# Patient Record
Sex: Male | Born: 1985
Health system: Southern US, Community
[De-identification: ages and names within clinical notes are randomized; demographics above are authoritative.]

## PROBLEM LIST (undated history)

## (undated) DIAGNOSIS — I10 Essential (primary) hypertension: Secondary | ICD-10-CM

## (undated) HISTORY — PX: SPLENECTOMY, TOTAL: SHX788

## (undated) HISTORY — PX: ADRENALECTOMY: SHX876

## (undated) HISTORY — PX: TOTAL NEPHRECTOMY: SHX415

## (undated) HISTORY — DX: Essential (primary) hypertension: I10

---

## 2019-09-25 DIAGNOSIS — K219 Gastro-esophageal reflux disease without esophagitis: Secondary | ICD-10-CM | POA: Diagnosis not present

## 2019-09-25 DIAGNOSIS — Z302 Encounter for sterilization: Secondary | ICD-10-CM | POA: Diagnosis not present

## 2019-09-25 DIAGNOSIS — I10 Essential (primary) hypertension: Secondary | ICD-10-CM | POA: Diagnosis not present

## 2019-10-26 DIAGNOSIS — Z3009 Encounter for other general counseling and advice on contraception: Secondary | ICD-10-CM | POA: Diagnosis not present

## 2019-10-30 DIAGNOSIS — Z302 Encounter for sterilization: Secondary | ICD-10-CM | POA: Diagnosis not present

## 2020-01-14 DIAGNOSIS — Z302 Encounter for sterilization: Secondary | ICD-10-CM | POA: Diagnosis not present

## 2020-04-17 DIAGNOSIS — E559 Vitamin D deficiency, unspecified: Secondary | ICD-10-CM | POA: Diagnosis not present

## 2020-04-17 DIAGNOSIS — M9080 Osteopathy in diseases classified elsewhere, unspecified site: Secondary | ICD-10-CM | POA: Diagnosis not present

## 2020-04-17 DIAGNOSIS — I10 Essential (primary) hypertension: Secondary | ICD-10-CM | POA: Diagnosis not present

## 2020-04-17 DIAGNOSIS — E781 Pure hyperglyceridemia: Secondary | ICD-10-CM | POA: Diagnosis not present

## 2020-04-17 DIAGNOSIS — Z1329 Encounter for screening for other suspected endocrine disorder: Secondary | ICD-10-CM | POA: Diagnosis not present

## 2020-04-17 DIAGNOSIS — E889 Metabolic disorder, unspecified: Secondary | ICD-10-CM | POA: Diagnosis not present

## 2020-04-17 DIAGNOSIS — Z905 Acquired absence of kidney: Secondary | ICD-10-CM | POA: Diagnosis not present

## 2020-04-22 DIAGNOSIS — I10 Essential (primary) hypertension: Secondary | ICD-10-CM | POA: Diagnosis not present

## 2020-04-22 DIAGNOSIS — E559 Vitamin D deficiency, unspecified: Secondary | ICD-10-CM | POA: Diagnosis not present

## 2020-04-22 DIAGNOSIS — E889 Metabolic disorder, unspecified: Secondary | ICD-10-CM | POA: Diagnosis not present

## 2020-04-22 DIAGNOSIS — Z905 Acquired absence of kidney: Secondary | ICD-10-CM | POA: Diagnosis not present

## 2020-06-25 DIAGNOSIS — M7711 Lateral epicondylitis, right elbow: Secondary | ICD-10-CM | POA: Diagnosis not present

## 2020-06-25 DIAGNOSIS — M25521 Pain in right elbow: Secondary | ICD-10-CM | POA: Diagnosis not present

## 2020-07-01 ENCOUNTER — Other Ambulatory Visit: Payer: Self-pay | Admitting: Physician Assistant

## 2020-07-01 DIAGNOSIS — M7711 Lateral epicondylitis, right elbow: Secondary | ICD-10-CM

## 2020-07-11 ENCOUNTER — Other Ambulatory Visit: Payer: Self-pay

## 2020-07-14 ENCOUNTER — Other Ambulatory Visit: Payer: Self-pay

## 2020-07-15 ENCOUNTER — Other Ambulatory Visit: Payer: Self-pay

## 2020-07-15 ENCOUNTER — Ambulatory Visit
Admission: RE | Admit: 2020-07-15 | Discharge: 2020-07-15 | Disposition: A | Discharge status: Home / Self Care | Payer: Federal, State, Local not specified - PPO | Source: Ambulatory Visit | Attending: Physician Assistant | Admitting: Physician Assistant

## 2020-07-15 DIAGNOSIS — M25521 Pain in right elbow: Secondary | ICD-10-CM | POA: Diagnosis not present

## 2020-07-15 DIAGNOSIS — M7711 Lateral epicondylitis, right elbow: Secondary | ICD-10-CM

## 2020-12-17 ENCOUNTER — Ambulatory Visit (INDEPENDENT_AMBULATORY_CARE_PROVIDER_SITE_OTHER): Payer: Federal, State, Local not specified - PPO | Admitting: Sports Medicine

## 2020-12-17 ENCOUNTER — Other Ambulatory Visit: Payer: Self-pay

## 2020-12-17 ENCOUNTER — Ambulatory Visit
Admission: RE | Admit: 2020-12-17 | Discharge: 2020-12-17 | Disposition: A | Discharge status: Home / Self Care | Payer: Federal, State, Local not specified - PPO | Source: Ambulatory Visit | Attending: Sports Medicine | Admitting: Sports Medicine

## 2020-12-17 DIAGNOSIS — S6991XA Unspecified injury of right wrist, hand and finger(s), initial encounter: Secondary | ICD-10-CM

## 2020-12-17 DIAGNOSIS — S62144A Nondisplaced fracture of body of hamate [unciform] bone, right wrist, initial encounter for closed fracture: Secondary | ICD-10-CM | POA: Diagnosis not present

## 2020-12-17 DIAGNOSIS — M7989 Other specified soft tissue disorders: Secondary | ICD-10-CM | POA: Diagnosis not present

## 2020-12-17 DIAGNOSIS — S62154A Nondisplaced fracture of hook process of hamate [unciform] bone, right wrist, initial encounter for closed fracture: Secondary | ICD-10-CM | POA: Diagnosis not present

## 2020-12-17 DIAGNOSIS — S62151A Displaced fracture of hook process of hamate [unciform] bone, right wrist, initial encounter for closed fracture: Secondary | ICD-10-CM | POA: Insufficient documentation

## 2020-12-17 MED ORDER — ACETAMINOPHEN ER 650 MG PO TBCR: 650.0000 mg | EXTENDED_RELEASE_TABLET | Freq: Three times a day (TID) | ORAL | 3 refills | Status: AC | PRN

## 2020-12-17 NOTE — Assessment & Plan Note (Addendum)
This is a pleasant 35 year old male, had a fall about a week and a half ago, severe pain, swelling on the ulnar aspect of his hand at the level of the carpus, he had an unofficial x-ray at the [place], read by the PA as negative, on exam he has severe tenderness at the base of the fifth metacarpal, pain with extension of the wrist, he does not have significant pain with ulnar deviation. It is swollen. Neurovascularly intact distally. I am going to go ahead and get a CT scan of his hand for full confirmation, in the meantime he will do arthritis from Tylenol, avoiding NSAIDs as he has a solitary kidney, we will also place him in a Velcro brace.  Update: There is an acute fracture through the hook of the hamate bone, continue the brace as discussed, we will consider cast placement when he comes back from his trip.

## 2020-12-17 NOTE — Progress Notes (Addendum)
    Procedures performed today:    None.  Independent interpretation of notes and tests performed by another provider:   None.  Brief History, Exam, Impression, and Recommendations:    Fracture of hook process of hamate of right wrist This is a pleasant 35 year old male, had a fall about a week and a half ago, severe pain, swelling on the ulnar aspect of his hand at the level of the carpus, he had an unofficial x-ray at the [place], read by the PA as negative, on exam he has severe tenderness at the base of the fifth metacarpal, pain with extension of the wrist, he does not have significant pain with ulnar deviation. It is swollen. Neurovascularly intact distally. I am going to go ahead and get a CT scan of his hand for full confirmation, in the meantime he will do arthritis from Tylenol, avoiding NSAIDs as he has a solitary kidney, we will also place him in a Velcro brace.  Update: There is an acute fracture through the hook of the hamate bone, continue the brace as discussed, we will consider cast placement when he comes back from his trip.    ___________________________________________ Ihor Austin. Benjamin Stain, M.D., ABFM., CAQSM. Primary Care and Sports Medicine  [facility]  Adjunct Instructor of Family Medicine  [employer]

## 2020-12-23 ENCOUNTER — Other Ambulatory Visit: Payer: Self-pay

## 2020-12-23 ENCOUNTER — Encounter: Payer: Federal, State, Local not specified - PPO | Admitting: Sports Medicine

## 2020-12-23 ENCOUNTER — Ambulatory Visit: Payer: Federal, State, Local not specified - PPO | Admitting: Sports Medicine

## 2020-12-23 DIAGNOSIS — S62154D Nondisplaced fracture of hook process of hamate [unciform] bone, right wrist, subsequent encounter for fracture with routine healing: Secondary | ICD-10-CM

## 2020-12-23 NOTE — Assessment & Plan Note (Signed)
Sean Powers returns, CT did show a nondisplaced fracture through the hook of the hamate, applied a short arm cast today, he will get a second opinion from Dr. Amanda Pea. Return to see me in 6 weeks for consideration of cast removal.

## 2020-12-23 NOTE — Progress Notes (Signed)
    Procedures performed today:    Short arm cast placed today  Independent interpretation of notes and tests performed by another provider:   None.  Brief History, Exam, Impression, and Recommendations:    Fracture of hook process of hamate of right wrist Sean Powers returns, CT did show a nondisplaced fracture through the hook of the hamate, applied a short arm cast today, he will get a second opinion from Dr. Amanda Pea. Return to see me in 6 weeks for consideration of cast removal.    ___________________________________________ Ihor Austin. Benjamin Stain, M.D., ABFM., CAQSM. Primary Care and Sports Medicine [facility]  Adjunct Instructor of Family Medicine  [employer]

## 2020-12-29 DIAGNOSIS — S62144A Nondisplaced fracture of body of hamate [unciform] bone, right wrist, initial encounter for closed fracture: Secondary | ICD-10-CM | POA: Diagnosis not present

## 2021-01-07 ENCOUNTER — Ambulatory Visit: Payer: Federal, State, Local not specified - PPO | Admitting: Sports Medicine

## 2021-01-12 ENCOUNTER — Other Ambulatory Visit: Payer: Self-pay | Admitting: Orthopedic Surgery

## 2021-01-12 DIAGNOSIS — S62144A Nondisplaced fracture of body of hamate [unciform] bone, right wrist, initial encounter for closed fracture: Secondary | ICD-10-CM | POA: Diagnosis not present

## 2021-01-12 DIAGNOSIS — M25531 Pain in right wrist: Secondary | ICD-10-CM | POA: Diagnosis not present

## 2021-02-03 ENCOUNTER — Ambulatory Visit: Payer: Federal, State, Local not specified - PPO | Admitting: Sports Medicine

## 2021-02-05 ENCOUNTER — Other Ambulatory Visit: Payer: Self-pay

## 2021-02-05 ENCOUNTER — Ambulatory Visit
Admission: RE | Admit: 2021-02-05 | Discharge: 2021-02-05 | Disposition: A | Discharge status: Home / Self Care | Payer: Federal, State, Local not specified - PPO | Source: Ambulatory Visit | Attending: Orthopedic Surgery | Admitting: Orthopedic Surgery

## 2021-02-05 DIAGNOSIS — S62154A Nondisplaced fracture of hook process of hamate [unciform] bone, right wrist, initial encounter for closed fracture: Secondary | ICD-10-CM | POA: Diagnosis not present

## 2021-02-05 DIAGNOSIS — S62144A Nondisplaced fracture of body of hamate [unciform] bone, right wrist, initial encounter for closed fracture: Secondary | ICD-10-CM

## 2021-02-05 DIAGNOSIS — M25531 Pain in right wrist: Secondary | ICD-10-CM | POA: Diagnosis not present

## 2021-02-05 DIAGNOSIS — S62141D Displaced fracture of body of hamate [unciform] bone, right wrist, subsequent encounter for fracture with routine healing: Secondary | ICD-10-CM | POA: Diagnosis not present

## 2021-02-06 ENCOUNTER — Other Ambulatory Visit: Payer: Self-pay | Admitting: Orthopedic Surgery

## 2021-02-06 DIAGNOSIS — S62144A Nondisplaced fracture of body of hamate [unciform] bone, right wrist, initial encounter for closed fracture: Secondary | ICD-10-CM

## 2021-03-20 ENCOUNTER — Other Ambulatory Visit: Payer: Self-pay

## 2021-03-20 ENCOUNTER — Ambulatory Visit
Admission: RE | Admit: 2021-03-20 | Discharge: 2021-03-20 | Disposition: A | Discharge status: Home / Self Care | Payer: Federal, State, Local not specified - PPO | Source: Ambulatory Visit | Attending: Orthopedic Surgery | Admitting: Orthopedic Surgery

## 2021-03-20 DIAGNOSIS — S62144A Nondisplaced fracture of body of hamate [unciform] bone, right wrist, initial encounter for closed fracture: Secondary | ICD-10-CM | POA: Diagnosis not present

## 2021-03-20 DIAGNOSIS — S62154A Nondisplaced fracture of hook process of hamate [unciform] bone, right wrist, initial encounter for closed fracture: Secondary | ICD-10-CM | POA: Diagnosis not present

## 2021-03-23 DIAGNOSIS — M25531 Pain in right wrist: Secondary | ICD-10-CM | POA: Diagnosis not present

## 2021-03-23 DIAGNOSIS — S62144A Nondisplaced fracture of body of hamate [unciform] bone, right wrist, initial encounter for closed fracture: Secondary | ICD-10-CM | POA: Diagnosis not present

## 2021-06-17 DIAGNOSIS — E559 Vitamin D deficiency, unspecified: Secondary | ICD-10-CM | POA: Diagnosis not present

## 2021-06-17 DIAGNOSIS — Z905 Acquired absence of kidney: Secondary | ICD-10-CM | POA: Diagnosis not present

## 2021-06-17 DIAGNOSIS — I10 Essential (primary) hypertension: Secondary | ICD-10-CM | POA: Diagnosis not present

## 2021-06-18 DIAGNOSIS — I10 Essential (primary) hypertension: Secondary | ICD-10-CM | POA: Diagnosis not present

## 2021-06-18 DIAGNOSIS — Z905 Acquired absence of kidney: Secondary | ICD-10-CM | POA: Diagnosis not present

## 2021-06-18 DIAGNOSIS — I456 Pre-excitation syndrome: Secondary | ICD-10-CM | POA: Diagnosis not present

## 2021-06-18 DIAGNOSIS — M898X9 Other specified disorders of bone, unspecified site: Secondary | ICD-10-CM | POA: Diagnosis not present

## 2021-07-08 ENCOUNTER — Other Ambulatory Visit: Payer: Self-pay | Admitting: Orthopedic Surgery

## 2021-07-08 DIAGNOSIS — S62144A Nondisplaced fracture of body of hamate [unciform] bone, right wrist, initial encounter for closed fracture: Secondary | ICD-10-CM

## 2021-08-11 ENCOUNTER — Ambulatory Visit
Admission: RE | Admit: 2021-08-11 | Discharge: 2021-08-11 | Disposition: A | Discharge status: Home / Self Care | Payer: Federal, State, Local not specified - PPO | Source: Ambulatory Visit | Attending: Orthopedic Surgery | Admitting: Orthopedic Surgery

## 2021-08-11 DIAGNOSIS — S62151A Displaced fracture of hook process of hamate [unciform] bone, right wrist, initial encounter for closed fracture: Secondary | ICD-10-CM | POA: Diagnosis not present

## 2021-08-11 DIAGNOSIS — S62144A Nondisplaced fracture of body of hamate [unciform] bone, right wrist, initial encounter for closed fracture: Secondary | ICD-10-CM

## 2021-08-14 ENCOUNTER — Other Ambulatory Visit: Payer: Federal, State, Local not specified - PPO

## 2021-08-17 DIAGNOSIS — S62144A Nondisplaced fracture of body of hamate [unciform] bone, right wrist, initial encounter for closed fracture: Secondary | ICD-10-CM | POA: Diagnosis not present

## 2021-08-17 DIAGNOSIS — M25531 Pain in right wrist: Secondary | ICD-10-CM | POA: Diagnosis not present

## 2021-09-22 HISTORY — PX: WRIST SURGERY: SHX841

## 2021-10-02 DIAGNOSIS — G8918 Other acute postprocedural pain: Secondary | ICD-10-CM | POA: Diagnosis not present

## 2021-10-02 DIAGNOSIS — S62151K Displaced fracture of hook process of hamate [unciform] bone, right wrist, subsequent encounter for fracture with nonunion: Secondary | ICD-10-CM | POA: Diagnosis not present

## 2021-10-14 DIAGNOSIS — Z4789 Encounter for other orthopedic aftercare: Secondary | ICD-10-CM | POA: Diagnosis not present

## 2021-10-15 DIAGNOSIS — Z4789 Encounter for other orthopedic aftercare: Secondary | ICD-10-CM | POA: Diagnosis not present

## 2021-10-22 DIAGNOSIS — M25641 Stiffness of right hand, not elsewhere classified: Secondary | ICD-10-CM | POA: Diagnosis not present

## 2021-10-29 DIAGNOSIS — M25641 Stiffness of right hand, not elsewhere classified: Secondary | ICD-10-CM | POA: Diagnosis not present

## 2021-11-13 DIAGNOSIS — M25641 Stiffness of right hand, not elsewhere classified: Secondary | ICD-10-CM | POA: Diagnosis not present

## 2021-12-31 ENCOUNTER — Encounter: Payer: Self-pay | Admitting: Emergency Medicine

## 2021-12-31 ENCOUNTER — Ambulatory Visit
Admission: EM | Admit: 2021-12-31 | Discharge: 2021-12-31 | Disposition: A | Discharge status: Home / Self Care | Payer: Federal, State, Local not specified - PPO | Attending: Family Medicine | Admitting: Family Medicine

## 2021-12-31 DIAGNOSIS — N451 Epididymitis: Secondary | ICD-10-CM

## 2021-12-31 HISTORY — DX: Essential (primary) hypertension: I10

## 2021-12-31 LAB — POCT URINALYSIS DIP (MANUAL ENTRY)
Bilirubin, UA: NEGATIVE
Blood, UA: NEGATIVE
Glucose, UA: NEGATIVE mg/dL
Ketones, POC UA: NEGATIVE mg/dL
Leukocytes, UA: NEGATIVE
Nitrite, UA: NEGATIVE
Protein Ur, POC: NEGATIVE mg/dL
Spec Grav, UA: 1.015 (ref 1.010–1.025)
Urobilinogen, UA: 0.2 E.U./dL
pH, UA: 7 (ref 5.0–8.0)

## 2021-12-31 MED ORDER — DOXYCYCLINE HYCLATE 100 MG PO CAPS: 100.0000 mg | ORAL_CAPSULE | Freq: Two times a day (BID) | ORAL | 0 refills | Status: AC

## 2021-12-31 NOTE — Discharge Instructions (Addendum)
Advised patient to take medication as directed with food to completion.  Encouraged patient increase daily water intake while taking this medication.  Advised patient if symptoms worsen and/or unresolved please follow-up with PCP, urology, local ED, or here for further evaluation.

## 2021-12-31 NOTE — ED Triage Notes (Signed)
Right testicular pain  Dull ache  Less pain today due to not being on his feet at much today  Vasectomy 2 years ago  Denies pain  No ice or elevation or OTC pain meds Pt only has 1 kidney  Left nephrectomy at age 36 due to trauma

## 2021-12-31 NOTE — ED Provider Notes (Signed)
[facility]    CSN: [id number] Arrival date & time: 12/31/21  1541      History   Chief Complaint Chief Complaint  Patient presents with   Testicle Pain    Right     HPI Sean Powers is a 36 y.o. male.   HPI 36 year old male presents with right testicle pain for 2 days.  Patient believes he may have epididymitis.  Reports ache is dull and less pain today as he was not on his feet much.  Reports a vasectomy 2 years ago.  Patient has only right kidney secondary to left nephrectomy at age 31 due to trauma.  PMH significant for HTN.   Past Medical History:  Diagnosis Date   Hypertension     Patient Active Problem List   Diagnosis Date Noted   Fracture of hook process of hamate of right wrist 12/17/2020    Past Surgical History:  Procedure Laterality Date   ADRENALECTOMY     age 30   SPLENECTOMY, TOTAL     age 21   TOTAL NEPHRECTOMY Left    age 8   WRIST SURGERY Right 09/2021   fracture       Home Medications    Prior to Admission medications   Medication Sig Start Date End Date Taking? Authorizing Provider  doxycycline (VIBRAMYCIN) 100 MG capsule Take 1 capsule (100 mg total) by mouth 2 (two) times daily for 10 days. 12/31/21 01/10/22 Yes Trevor Iha, FNP  acetaminophen (TYLENOL) 650 MG CR tablet Take 1 tablet (650 mg total) by mouth every 8 (eight) hours as needed for pain. 12/17/20   Monica Becton, MD  Cholecalciferol 125 MCG (5000 UT) TABS Take by mouth.    [provider]  diltiazem (TIAZAC) 300 MG 24 hr capsule Take 1 capsule by mouth daily. 04/22/20   [provider]  omeprazole (PRILOSEC) 20 MG capsule Take 1 capsule by mouth daily. 05/31/16   [provider]    Family History Family History  Problem Relation Age of Onset   Hypertension Mother    Healthy Father     Social History Social History   Tobacco Use   Smoking status: Never    Passive exposure: Never   Smokeless tobacco: Never  Vaping  Use   Vaping Use: Never used  Substance Use Topics   Alcohol use: Yes    Alcohol/week: 6.0 standard drinks of alcohol    Types: 6 Cans of beer per week     Allergies   Patient has no known allergies.   Review of Systems Review of Systems  Genitourinary:  Positive for testicular pain.  All other systems reviewed and are negative.    Physical Exam Triage Vital Signs ED Triage Vitals  Enc Vitals Group     BP 12/31/21 1637 (!) 151/97     Pulse Rate 12/31/21 1637 94     Resp 12/31/21 1637 14     Temp 12/31/21 1637 98.6 F (37 C)     Temp Source 12/31/21 1637 Oral     SpO2 12/31/21 1637 100 %     Weight 12/31/21 1639 233 lb (105.7 kg)     Height 12/31/21 1639 6\' 3"  (1.905 m)     Head Circumference --      Peak Flow --      Pain Score 12/31/21 1639 4     Pain Loc --      Pain Edu? --      Excl. in [place]? --  No data found.  Updated Vital Signs BP (!) 151/97 (BP Location: Left Arm)   Pulse 94   Temp 98.6 F (37 C) (Oral)   Resp 14   Ht 6\' 3"  (1.905 m)   Wt 233 lb (105.7 kg)   SpO2 100%   BMI 29.12 kg/m      Physical Exam Vitals and nursing note reviewed.  Constitutional:      Appearance: Normal appearance. He is normal weight.  HENT:     Head: Normocephalic and atraumatic.     Mouth/Throat:     Mouth: Mucous membranes are moist.     Pharynx: Oropharynx is clear.  Eyes:     Extraocular Movements: Extraocular movements intact.     Conjunctiva/sclera: Conjunctivae normal.     Pupils: Pupils are equal, round, and reactive to light.  Cardiovascular:     Rate and Rhythm: Normal rate and regular rhythm.     Pulses: Normal pulses.     Heart sounds: Normal heart sounds.  Pulmonary:     Effort: Pulmonary effort is normal.     Breath sounds: Normal breath sounds. No wheezing, rhonchi or rales.  Genitourinary:    Comments: Posterior pole of right testicle, TTP/swollen epididymis, small (~1.5 cm) oval-shaped varicocele note Musculoskeletal:        General:  Normal range of motion.     Cervical back: Normal range of motion and neck supple.  Skin:    General: Skin is warm and dry.  Neurological:     General: No focal deficit present.     Mental Status: He is alert and oriented to person, place, and time.      UC Treatments / Results  Labs (all labs ordered are listed, but only abnormal results are displayed) Labs Reviewed  POCT URINALYSIS DIP (MANUAL ENTRY)    EKG   Radiology No results found.  Procedures Procedures (including critical care time)  Medications Ordered in UC Medications - No data to display  Initial Impression / Assessment and Plan / UC Course  I have reviewed the triage vital signs and the nursing notes.  Pertinent labs & imaging results that were available during my care of the patient were reviewed by me and considered in my medical decision making (see chart for details).     MDM: 1. Epididymitis, right-Rx'd Doxycycline. Advised patient to take medication as directed with food to completion.  Encouraged patient increase daily water intake while taking this medication.  Advised patient if symptoms worsen and/or unresolved please follow-up with PCP, urology, local ED, or here for further evaluation. Advised patient to take medication as directed with food to completion.  Encouraged patient increase daily water intake while taking this medication.  Advised patient if symptoms worsen and/or unresolved please follow-up with PCP, urology, local ED, or here for further evaluation.  Discharged home, hemodynamically stable. Final Clinical Impressions(s) / UC Diagnoses   Final diagnoses:  Epididymitis, right     Discharge Instructions      Advised patient to take medication as directed with food to completion.  Encouraged patient increase daily water intake while taking this medication.  Advised patient if symptoms worsen and/or unresolved please follow-up with PCP, urology, local ED, or here for further  evaluation.     ED Prescriptions     Medication Sig Dispense Auth. Provider   doxycycline (VIBRAMYCIN) 100 MG capsule Take 1 capsule (100 mg total) by mouth 2 (two) times daily for 10 days. 20 capsule , FNP  PDMP not reviewed this encounter.   Trevor Iha, FNP 12/31/21 1712

## 2022-01-01 ENCOUNTER — Telehealth: Payer: Self-pay

## 2022-01-01 NOTE — Telephone Encounter (Signed)
TC to f/u with pt after yesterday's visit to [facility]. Pt states he is doing okay and has no problems or questions at this time.

## 2022-04-15 ENCOUNTER — Encounter: Payer: Self-pay | Admitting: Emergency Medicine

## 2022-04-15 ENCOUNTER — Ambulatory Visit
Admission: EM | Admit: 2022-04-15 | Discharge: 2022-04-15 | Disposition: A | Discharge status: Home / Self Care | Payer: Federal, State, Local not specified - PPO | Attending: Family Medicine | Admitting: Family Medicine

## 2022-04-15 DIAGNOSIS — J111 Influenza due to unidentified influenza virus with other respiratory manifestations: Secondary | ICD-10-CM

## 2022-04-15 LAB — POCT INFLUENZA A/B
Influenza A, POC: NEGATIVE
Influenza B, POC: NEGATIVE

## 2022-04-15 LAB — POC SARS CORONAVIRUS 2 AG -  ED: SARS Coronavirus 2 Ag: NEGATIVE

## 2022-04-15 MED ORDER — ACETAMINOPHEN 500 MG PO TABS: 1000.0000 mg | ORAL_TABLET | Freq: Once | ORAL | Status: DC

## 2022-04-15 NOTE — ED Provider Notes (Signed)
[facility]   22 CSN: [id number] Arrival date & time: 04/15/22  1034      History   Chief Complaint Chief Complaint  Patient presents with   Cough    HPI Sean Powers is a 37 y.o. male.   HPI  Patient is a Marine scientist at the [employer].  He states that he had some cough and congestion yesterday.  Today he developed fever, body aches, headache.  Temperature last night was 102.  Tried to go to work today but started feeling worse.  Body aches and chills.  Provide he presents today with a temperature of 101.6 and pulse of 110.  He is fully vaccinated  Past Medical History:  Diagnosis Date   Hypertension     Patient Active Problem List   Diagnosis Date Noted   Fracture of hook process of hamate of right wrist 12/17/2020    Past Surgical History:  Procedure Laterality Date   ADRENALECTOMY     age 70   SPLENECTOMY, TOTAL     age 28   TOTAL NEPHRECTOMY Left    age 14   WRIST SURGERY Right 09/2021   fracture       Home Medications    Prior to Admission medications   Medication Sig Start Date End Date Taking? Authorizing Provider  acetaminophen (TYLENOL) 650 MG CR tablet Take 1 tablet (650 mg total) by mouth every 8 (eight) hours as needed for pain. 12/17/20  Yes Silverio Decamp, MD  Cholecalciferol 125 MCG (5000 UT) TABS Take by mouth.   Yes [provider]  diltiazem (TIAZAC) 300 MG 24 hr capsule Take 1 capsule by mouth daily. 04/22/20  Yes [provider]  omeprazole (PRILOSEC) 20 MG capsule Take 1 capsule by mouth daily. 05/31/16  Yes [provider]    Family History Family History  Problem Relation Age of Onset   Hypertension Mother    Healthy Father     Social History Social History   Tobacco Use   Smoking status: Never    Passive exposure: Never   Smokeless tobacco: Never  Vaping Use   Vaping Use: Never used  Substance Use Topics   Alcohol use: Yes    Alcohol/week: 6.0 standard drinks of alcohol     Types: 6 Cans of beer per week   Drug use: Never     Allergies   Patient has no known allergies.   Review of Systems Review of Systems See HPI  Physical Exam Triage Vital Signs ED Triage Vitals  Enc Vitals Group     BP 04/15/22 1118 125/82     Pulse Rate 04/15/22 1118 (!) 110     Resp 04/15/22 1118 18     Temp 04/15/22 1118 (!) 101.6 F (38.7 C)     Temp Source 04/15/22 1118 Oral     SpO2 04/15/22 1118 100 %     Weight 04/15/22 1120 225 lb (102.1 kg)     Height 04/15/22 1120 6' 3"$  (1.905 m)     Head Circumference --      Peak Flow --      Pain Score 04/15/22 1120 2     Pain Loc --      Pain Edu? --      Excl. in [place]? --    No data found.  Updated Vital Signs BP 125/82 (BP Location: Right Arm)   Pulse (!) 110   Temp (!) 101.6 F (38.7 C) (Oral)   Resp 18  Ht 6' 3"$  (1.905 m)   Wt 102.1 kg   SpO2 100%   BMI 28.12 kg/m      Physical Exam Constitutional:      General: He is not in acute distress.    Appearance: He is well-developed. He is diaphoretic.  HENT:     Head: Normocephalic and atraumatic.     Right Ear: Tympanic membrane and ear canal normal.     Left Ear: Tympanic membrane and ear canal normal.     Nose: Rhinorrhea present.     Mouth/Throat:     Pharynx: Posterior oropharyngeal erythema present.  Eyes:     Conjunctiva/sclera: Conjunctivae normal.     Pupils: Pupils are equal, round, and reactive to light.  Cardiovascular:     Rate and Rhythm: Tachycardia present.     Heart sounds: Normal heart sounds.  Pulmonary:     Effort: Pulmonary effort is normal. No respiratory distress.     Breath sounds: Normal breath sounds. No wheezing or rhonchi.  Abdominal:     General: There is no distension.     Palpations: Abdomen is soft.  Musculoskeletal:        General: Normal range of motion.     Cervical back: Normal range of motion.  Skin:    General: Skin is warm.  Neurological:     Mental Status: He is alert.      UC Treatments / Results   Labs (all labs ordered are listed, but only abnormal results are displayed) Labs Reviewed  POC SARS CORONAVIRUS 2 AG -  ED  POCT INFLUENZA A/B    EKG   Radiology No results found.  Procedures Procedures (including critical care time)  Medications Ordered in UC Medications  acetaminophen (TYLENOL) tablet 1,000 mg (1,000 mg Oral Patient Refused/Not Given 04/15/22 1137)    Initial Impression / Assessment and Plan / UC Course  I have reviewed the triage vital signs and the nursing notes.  Pertinent labs & imaging results that were available during my care of the patient were reviewed by me and considered in my medical decision making (see chart for details).     The flu and COVID tests are both negative Patient has a flulike illness.  Treat symptomatically Final Clinical Impressions(s) / UC Diagnoses   Final diagnoses:  Influenza-like illness     Discharge Instructions      Drink lots of fluids Take over-the-counter cough and cold medicine as needed Tylenol or ibuprofen for pain and fever Follow-up with your doctor if not improving by next week     ED Prescriptions   None    PDMP not reviewed this encounter.   Raylene Everts, MD 04/15/22 1146

## 2022-04-15 NOTE — Discharge Instructions (Signed)
Drink lots of fluids Take over-the-counter cough and cold medicine as needed Tylenol or ibuprofen for pain and fever Follow-up with your doctor if not improving by next week

## 2022-04-15 NOTE — ED Notes (Signed)
Offered patient Tylenol @ triage and patient refused, states he will wait until he gets home.

## 2022-04-15 NOTE — ED Triage Notes (Signed)
Patient c/o cough, low grade fever, congestion x 2 days.  Patient has taken Dayquil, fever last night was 102.  Body aches and chills.  Patient has taken Nyquil and Tylenol.

## 2022-04-21 DIAGNOSIS — L814 Other melanin hyperpigmentation: Secondary | ICD-10-CM | POA: Diagnosis not present

## 2022-04-21 DIAGNOSIS — Z129 Encounter for screening for malignant neoplasm, site unspecified: Secondary | ICD-10-CM | POA: Diagnosis not present

## 2022-04-21 DIAGNOSIS — L720 Epidermal cyst: Secondary | ICD-10-CM | POA: Diagnosis not present

## 2022-04-21 DIAGNOSIS — D225 Melanocytic nevi of trunk: Secondary | ICD-10-CM | POA: Diagnosis not present

## 2022-11-18 DIAGNOSIS — E559 Vitamin D deficiency, unspecified: Secondary | ICD-10-CM | POA: Diagnosis not present

## 2022-11-18 DIAGNOSIS — R0789 Other chest pain: Secondary | ICD-10-CM | POA: Diagnosis not present

## 2022-11-18 DIAGNOSIS — Z0000 Encounter for general adult medical examination without abnormal findings: Secondary | ICD-10-CM | POA: Diagnosis not present

## 2022-11-18 DIAGNOSIS — I10 Essential (primary) hypertension: Secondary | ICD-10-CM | POA: Diagnosis not present

## 2022-11-18 DIAGNOSIS — K219 Gastro-esophageal reflux disease without esophagitis: Secondary | ICD-10-CM | POA: Diagnosis not present

## 2022-11-25 DIAGNOSIS — Z905 Acquired absence of kidney: Secondary | ICD-10-CM | POA: Diagnosis not present

## 2022-11-25 DIAGNOSIS — I456 Pre-excitation syndrome: Secondary | ICD-10-CM | POA: Diagnosis not present

## 2022-11-25 DIAGNOSIS — M898X9 Other specified disorders of bone, unspecified site: Secondary | ICD-10-CM | POA: Diagnosis not present

## 2022-11-25 DIAGNOSIS — I10 Essential (primary) hypertension: Secondary | ICD-10-CM | POA: Diagnosis not present

## 2023-06-17 ENCOUNTER — Encounter: Payer: Self-pay | Admitting: Gastroenterology

## 2023-08-16 IMAGING — CT CT WRIST*R* W/O CM
1 series · 12 of 14 positions shown, 15 images · non-contrast
Comparison: CT hand 12/17/2020.

CLINICAL DATA: Fractured hamate bone, followup. History of injury
to hand.

EXAM:
CT OF THE RIGHT WRIST WITHOUT CONTRAST
TECHNIQUE: Multidetector CT imaging of the right wrist was performed according
to the standard protocol. Multiplanar CT image reconstructions were
also generated.

[Series 3: ext-soft · axial · 0.35mm/px · z∈[+26,+124]mm · 12 of 59 slices shown, 15 images]
[im 5/59  soft-tissue]
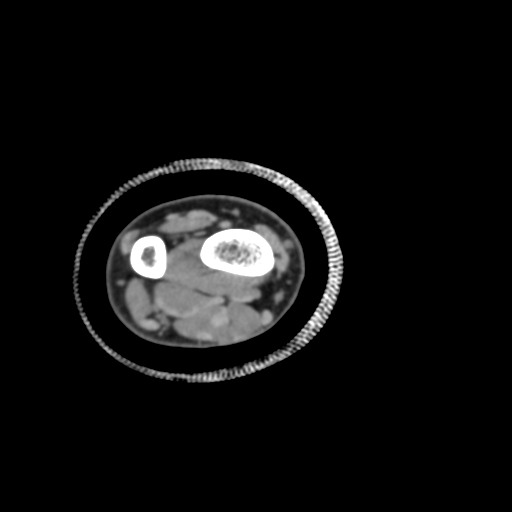
[im 5/59  bone]
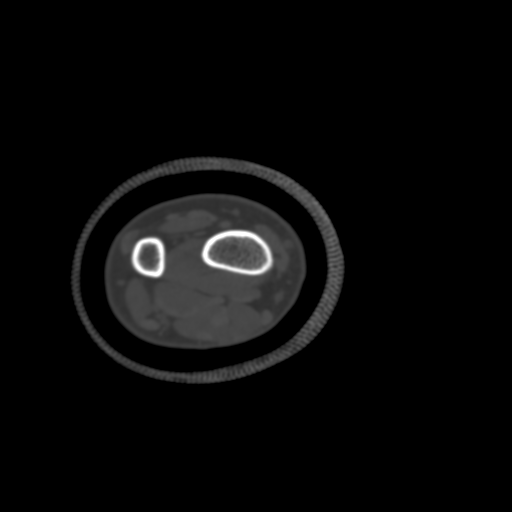
[im 9/59  bone]
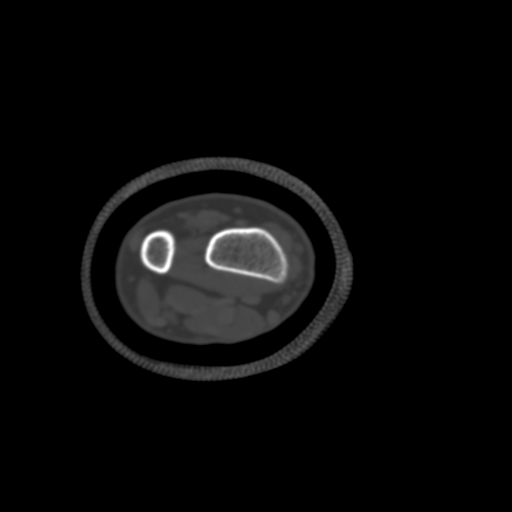
[im 14/59  bone]
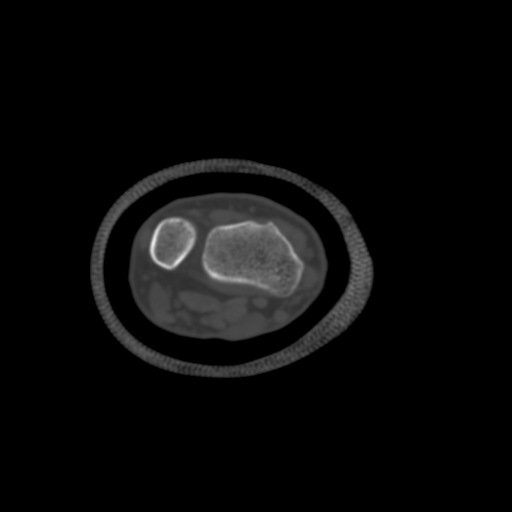
[im 18/59  bone]
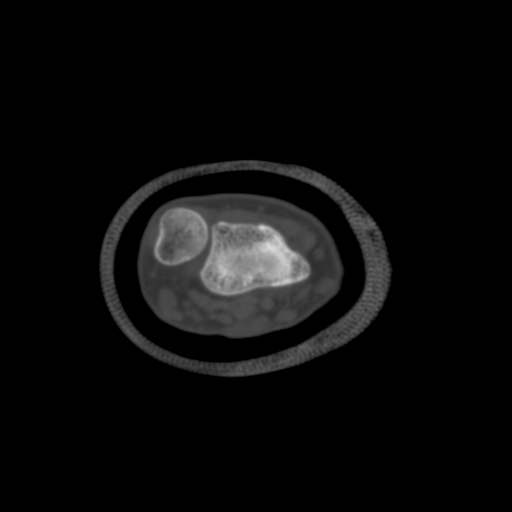
[im 23/59  soft-tissue]
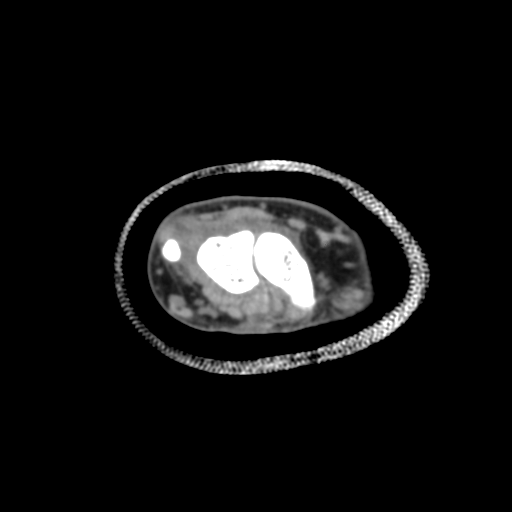
[im 23/59  bone]
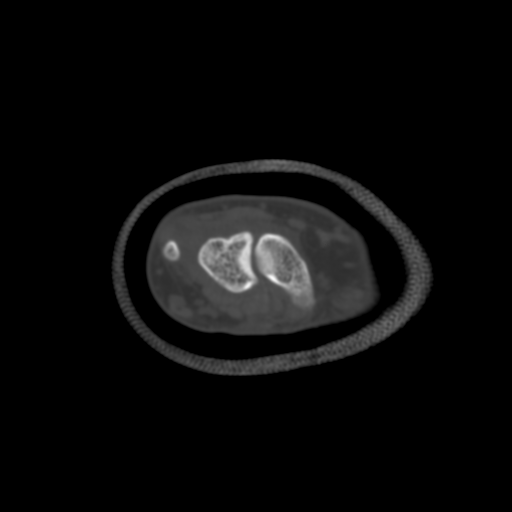
[im 27/59  bone]
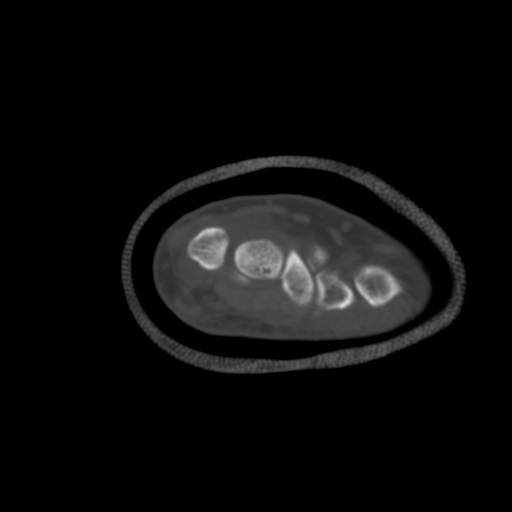
[im 32/59  bone]
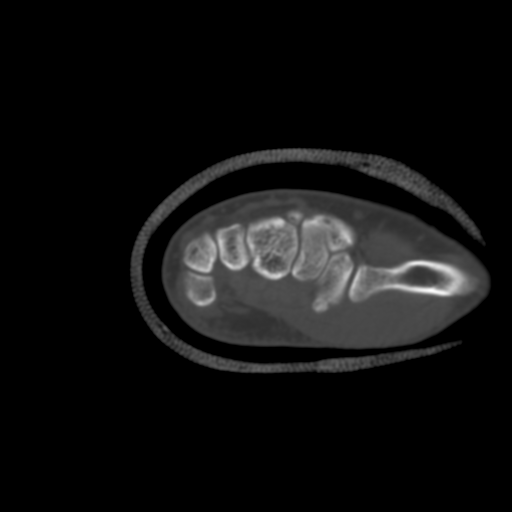
[im 36/59  bone]
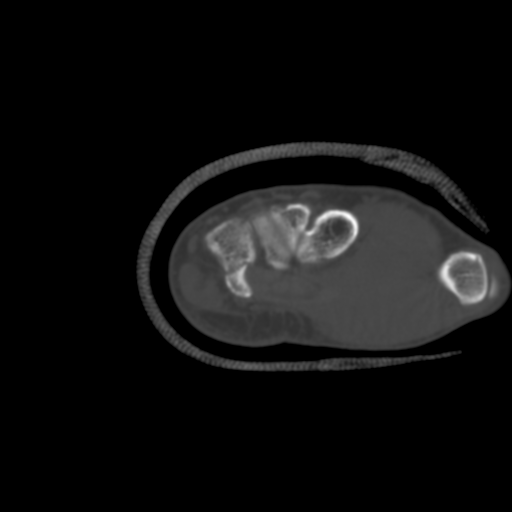
[im 41/59  soft-tissue]
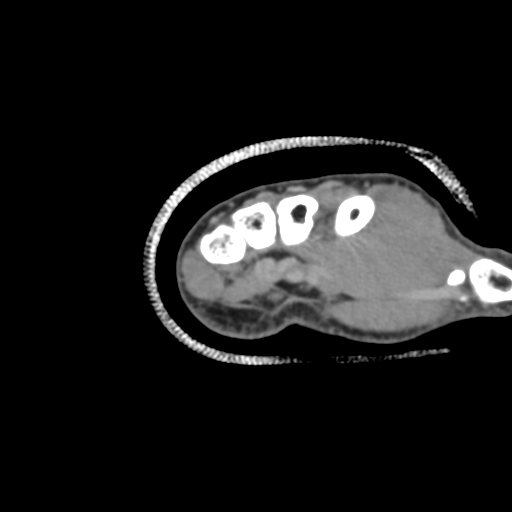
[im 41/59  bone]
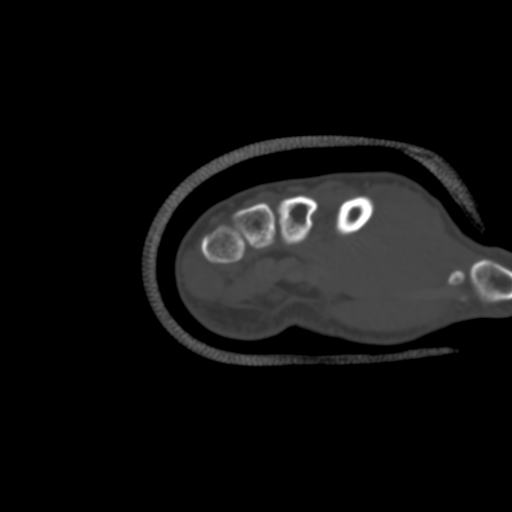
[im 45/59  bone]
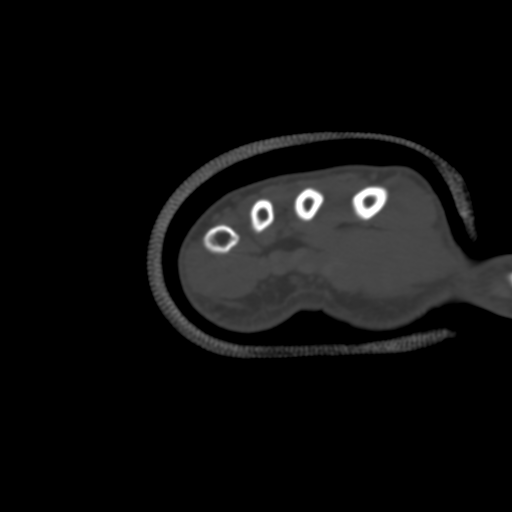
[im 50/59  bone]
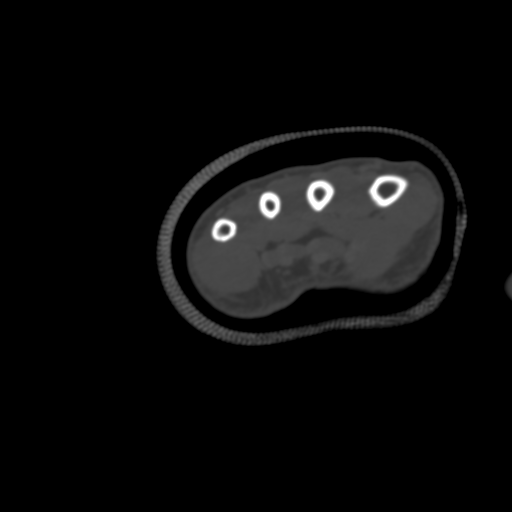
[im 54/59  bone]
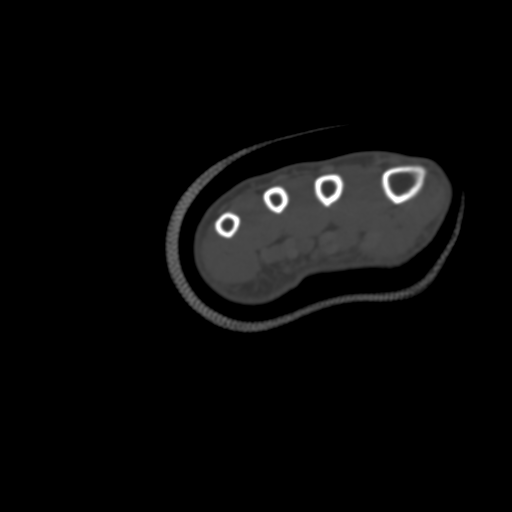

[12 of 14 positions shown; findings below may reference images not displayed]

FINDINGS: Bones/Joint/Cartilage

There are mild healing changes of the nondisplaced fracture at the
base of the hook of hamate. Persistent fracture line without solid
bony bridging at this time. Unchanged alignment. No other acute
fracture. Joint spaces are maintained. No erosions. No significant
arthritis.

Ligaments

Suboptimally assessed by CT.

Muscles and Tendons

The musculotendinous structures appear unremarkable by CT.

Soft tissues

No focal fluid collection.  Cast in place.
IMPRESSION: Incomplete healing of the nondisplaced fracture at the base of the
hook of hamate. Unchanged alignment. No solid bony bridging at this
time.

## 2023-09-07 ENCOUNTER — Ambulatory Visit: Admitting: Gastroenterology

## 2023-10-25 ENCOUNTER — Encounter: Payer: Self-pay | Admitting: Sports Medicine
